# Patient Record
Sex: Male | Born: 1985 | Race: Black or African American | Hispanic: No | Marital: Married | Smoking: Never smoker
Health system: Southern US, Community
[De-identification: ages and names within clinical notes are randomized; demographics above are authoritative.]

---
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
## 2007-05-02 ENCOUNTER — Emergency Department (HOSPITAL_COMMUNITY): Admission: EM | Admit: 2007-05-02 | Discharge: 2007-05-02 | Payer: Self-pay | Admitting: Emergency Medicine

## 2011-02-25 LAB — URINALYSIS, ROUTINE W REFLEX MICROSCOPIC
Nitrite: NEGATIVE
Urobilinogen, UA: 0.2

## 2011-02-25 LAB — URINE CULTURE

## 2011-02-25 LAB — RPR: RPR Ser Ql: NONREACTIVE

## 2011-02-25 LAB — GC/CHLAMYDIA PROBE AMP, GENITAL: GC Probe Amp, Genital: POSITIVE — AB

## 2011-02-25 LAB — URINE MICROSCOPIC-ADD ON

## 2011-10-02 ENCOUNTER — Other Ambulatory Visit: Payer: Self-pay | Admitting: Medical Oncology

## 2014-05-05 ENCOUNTER — Encounter (HOSPITAL_BASED_OUTPATIENT_CLINIC_OR_DEPARTMENT_OTHER): Payer: Self-pay | Admitting: *Deleted

## 2014-05-05 ENCOUNTER — Emergency Department (HOSPITAL_BASED_OUTPATIENT_CLINIC_OR_DEPARTMENT_OTHER)
Admission: EM | Admit: 2014-05-05 | Discharge: 2014-05-05 | Disposition: A | Discharge status: Home / Self Care | Payer: Medicaid Other | Attending: Emergency Medicine | Admitting: Emergency Medicine

## 2014-05-05 DIAGNOSIS — Z792 Long term (current) use of antibiotics: Secondary | ICD-10-CM | POA: Diagnosis not present

## 2014-05-05 DIAGNOSIS — K088 Other specified disorders of teeth and supporting structures: Secondary | ICD-10-CM | POA: Insufficient documentation

## 2014-05-05 DIAGNOSIS — K0889 Other specified disorders of teeth and supporting structures: Secondary | ICD-10-CM

## 2014-05-05 MED ORDER — HYDROCODONE-ACETAMINOPHEN 5-325 MG PO TABS: 2.0000 | ORAL_TABLET | Freq: Four times a day (QID) | ORAL | Status: DC | PRN

## 2014-05-05 NOTE — Discharge Instructions (Signed)
Continue your amoxicillin as prescribed.  Hydrocodone as prescribed as needed for pain.  Follow-up with dentistry.   Dental Pain A tooth ache may be caused by cavities (tooth decay). Cavities expose the nerve of the tooth to air and hot or cold temperatures. It may come from an infection or abscess (also called a boil or furuncle) around your tooth. It is also often caused by dental caries (tooth decay). This causes the pain you are having. DIAGNOSIS  Your caregiver can diagnose this problem by exam. TREATMENT   If caused by an infection, it may be treated with medications which kill germs (antibiotics) and pain medications as prescribed by your caregiver. Take medications as directed.  Only take over-the-counter or prescription medicines for pain, discomfort, or fever as directed by your caregiver.  Whether the tooth ache today is caused by infection or dental disease, you should see your dentist as soon as possible for further care. SEEK MEDICAL CARE IF: The exam and treatment you received today has been provided on an emergency basis only. This is not a substitute for complete medical or dental care. If your problem worsens or new problems (symptoms) appear, and you are unable to meet with your dentist, call or return to this location. SEEK IMMEDIATE MEDICAL CARE IF:   You have a fever.  You develop redness and swelling of your face, jaw, or neck.  You are unable to open your mouth.  You have severe pain uncontrolled by pain medicine. MAKE SURE YOU:   Understand these instructions.  Will watch your condition.  Will get help right away if you are not doing well or get worse. Document Released: 05/06/2005 Document Revised: 07/29/2011 Document Reviewed: 12/23/2007 [facility] Patient Information 2015 [employer] [place]. This information is not intended to replace advice given to you by your health care provider. Make sure you discuss any questions you have with your health care  provider.

## 2014-05-05 NOTE — ED Notes (Signed)
C/o rt lower tooth pain x 1 week

## 2014-05-05 NOTE — ED Notes (Signed)
Rt bottom pain x 1 week  Saw dentist was given antibiotics but cannot have tooth removed till end of December

## 2014-05-05 NOTE — ED Provider Notes (Signed)
CSN: [id number]     Arrival date & time 05/05/14  [id number] History   First MD Initiated Contact with Patient 05/05/14 [phone]     Chief Complaint  Patient presents with  . Dental Pain     (Consider location/radiation/quality/duration/timing/severity/associated sxs/prior Treatment) Patient is a 28 y.o. male presenting with tooth pain. The history is provided by the patient.  Dental Pain Location:  Lower Lower teeth location:  26/RL lateral incisor Quality:  Throbbing Severity:  Moderate Onset quality:  Gradual Duration:  1 week Timing:  Constant Progression:  Worsening Chronicity:  New Context: not abscess and not dental fracture   Relieved by:  Nothing Worsened by:  Nothing tried   History reviewed. No pertinent past medical history. History reviewed. No pertinent past surgical history. No family history on file. History  Substance Use Topics  . Smoking status: Never Smoker   . Smokeless tobacco: Not on file  . Alcohol Use: No    Review of Systems  All other systems reviewed and are negative.     Allergies  Review of patient's allergies indicates no known allergies.  Home Medications   Prior to Admission medications   Not on File   BP 132/79 mmHg  Pulse 62  Temp(Src) 98.3 F (36.8 C) (Oral)  Resp 20  Ht 6\' 4"  (1.93 m)  Wt 175 lb (79.379 kg)  BMI 21.31 kg/m2  SpO2 99% Physical Exam  Constitutional: He is oriented to person, place, and time. He appears well-developed and well-nourished. No distress.  HENT:  Head: Normocephalic and atraumatic.  Mouth/Throat: Oropharynx is clear and moist.  The right lower lateral incisor is noted to have some erosion of the gumline, however no obvious caries, abscess, or swelling.  Neck: Normal range of motion. Neck supple.  Lymphadenopathy:    He has no cervical adenopathy.  Neurological: He is alert and oriented to person, place, and time.  Skin: Skin is warm and dry. He is not diaphoretic.  Nursing note and vitals  reviewed.   ED Course  Procedures (including critical care time) Labs Review Labs Reviewed - No data to display  Imaging Review No results found.   EKG Interpretation None      MDM   Final diagnoses:  None    Patient is already on amoxicillin for the past 2 days. This was prescribed by his dentist. He presents today complaining that his pain is worsening. He will be prescribed a small quantity of hydrocodone and advised to follow-up with his dentist. He is concerned that his dentist can get him in until the end of December. I will provide the on-call dentist contact information whom he can attempt to schedule an appointment if he is not satisfied with his current dentist.    Geoffery Lyonsouglas Ammy Lienhard, MD 05/05/14 [phone]

## 2016-03-22 ENCOUNTER — Emergency Department (HOSPITAL_BASED_OUTPATIENT_CLINIC_OR_DEPARTMENT_OTHER)
Admission: EM | Admit: 2016-03-22 | Discharge: 2016-03-23 | Disposition: A | Discharge status: Home / Self Care | Payer: Medicare HMO | Attending: Emergency Medicine | Admitting: Emergency Medicine

## 2016-03-22 ENCOUNTER — Encounter (HOSPITAL_BASED_OUTPATIENT_CLINIC_OR_DEPARTMENT_OTHER): Payer: Self-pay | Admitting: *Deleted

## 2016-03-22 DIAGNOSIS — R1013 Epigastric pain: Secondary | ICD-10-CM

## 2016-03-22 DIAGNOSIS — R109 Unspecified abdominal pain: Secondary | ICD-10-CM | POA: Diagnosis present

## 2016-03-22 NOTE — ED Provider Notes (Signed)
[facility] Provider Note: [name] Arsh Feutz, MD, FACEP  CSN: [id number] MRN: [id number] ARRIVAL: 03/22/16 at 2341 ROOM: [facility]06/[facility]06 By signing my name below, I, Glenn Diaz, attest that this documentation has been prepared under the direction and in the presence of Paula LibraJohn Angenette Daily, MD . Electronically Signed: Levon HedgerElizabeth Diaz, Scribe. 03/23/2016. 12:02 AM.   CHIEF COMPLAINT  Abdominal Pain  HISTORY OF PRESENT ILLNESS  Glenn Diaz is a 30 y.o. male who presents to the Emergency Department complaining of intermittent abdominal pain onset one week ago. He states abdominal pain is exacerbated by eating or drinking. He has difficulty characterizing it but does not describe it as a burning sensation. He rates the pain as 8/10 in severity. Pt notes associated nausea, headaches and one episode of diarrhea yesterday, although nurses' notes indicate he denied nausea, vomiting and diarrhea during triage.  History reviewed. No pertinent past medical history.  History reviewed. No pertinent surgical history.  History reviewed. No pertinent family history.  Social History  Substance Use Topics  . Smoking status: Never Smoker  . Smokeless tobacco: Never Used  . Alcohol use No    Prior to Admission medications   Medication Sig Start Date End Date Taking? Authorizing Provider  omeprazole (PRILOSEC) 40 MG capsule Take one capsule every morning at least 30 minutes before first dose of Carafate. 03/23/16   Chesnee Floren, MD  sucralfate (CARAFATE) 1 g tablet Take 1 tablet (1 g total) by mouth 4 (four) times daily -  with meals and at bedtime. 03/23/16   Paula LibraJohn Tanaiya Kolarik, MD    Allergies Review of patient's allergies indicates no known allergies.   REVIEW OF SYSTEMS  Negative except as noted here or in the History of Present Illness.   PHYSICAL EXAMINATION  Initial Vital Signs Blood pressure 114/72, pulse 76, temperature 98 F (36.7 C), temperature source Oral, resp. rate 18, height 6\' 4"  (1.93  m), weight 165 lb (74.8 kg), SpO2 99 %.  Examination General: Well-developed, well-nourished male in no acute distress; appearance consistent with age of record HENT: normocephalic; atraumatic Eyes: pupils equal, round and reactive to light; extraocular muscles intact Neck: supple Heart: regular rate and rhythm Lungs: clear to auscultation bilaterally Abdomen: soft; nondistended; mild epigastric tenderness; no masses or hepatosplenomegaly; bowel sounds present Extremities: No deformity; full range of motion; pulses normal Neurologic: Awake, alert and oriented; motor function intact in all extremities and symmetric; no facial droop Skin: Warm and dry Psychiatric: Normal mood and affect   RESULTS  Summary of this visit's results, reviewed by myself:   EKG Interpretation  Date/Time:    Ventricular Rate:    PR Interval:    QRS Duration:   QT Interval:    QTC Calculation:   R Axis:     Text Interpretation:        Laboratory Studies: No results found for this or any previous visit (from the past 24 hour(s)). Imaging Studies: No results found.  ED COURSE  Nursing notes and initial vitals signs, including pulse oximetry, reviewed.  Vitals:   03/22/16 2347  BP: 114/72  Pulse: 76  Resp: 18  Temp: 98 F (36.7 C)  TempSrc: Oral  SpO2: 99%  Weight: 165 lb (74.8 kg)  Height: 6\' 4"  (1.93 m)   1:25 AM Feels better after Carafate orally. We'll treat for gastritis.  PROCEDURES    ED DIAGNOSES     ICD-9-CM ICD-10-CM   1. Epigastric abdominal pain 789.06 R10.13      I personally performed  the services described in this documentation, which was scribed in my presence. The recorded information has been reviewed and is accurate.    Paula LibraJohn Nathaly Dawkins, MD 03/23/16 [phone]

## 2016-03-22 NOTE — ED Triage Notes (Signed)
Pt reports that his abdomen hurts whenever he eats or drinks.  Denies N/V/D.  States that soda isnt 'sitting right with his stomach' and that when he 'eats he has to go to the bathroom'.  No distress noted.

## 2016-03-23 DIAGNOSIS — R1013 Epigastric pain: Secondary | ICD-10-CM | POA: Diagnosis not present

## 2016-03-23 MED ORDER — OMEPRAZOLE 40 MG PO CPDR: DELAYED_RELEASE_CAPSULE | ORAL | 0 refills | Status: DC

## 2016-03-23 MED ORDER — PANTOPRAZOLE SODIUM 40 MG PO TBEC
40.0000 mg | DELAYED_RELEASE_TABLET | Freq: Once | ORAL | Status: AC
  Administered 2016-03-23: 40 mg via ORAL
  Filled 2016-03-23: qty 1

## 2016-03-23 MED ORDER — SUCRALFATE 1 G PO TABS
1.0000 g | ORAL_TABLET | Freq: Once | ORAL | Status: AC
  Administered 2016-03-23: 1 g via ORAL
  Filled 2016-03-23: qty 1

## 2016-03-23 MED ORDER — SUCRALFATE 1 G PO TABS: 1.0000 g | ORAL_TABLET | Freq: Three times a day (TID) | ORAL | 0 refills | Status: DC

## 2016-03-23 NOTE — ED Notes (Signed)
Patient states that he is feeling better.

## 2016-08-20 ENCOUNTER — Emergency Department (HOSPITAL_BASED_OUTPATIENT_CLINIC_OR_DEPARTMENT_OTHER)
Admission: EM | Admit: 2016-08-20 | Discharge: 2016-08-20 | Disposition: A | Discharge status: Home / Self Care | Payer: Medicare HMO | Attending: Emergency Medicine | Admitting: Emergency Medicine

## 2016-08-20 ENCOUNTER — Encounter (HOSPITAL_BASED_OUTPATIENT_CLINIC_OR_DEPARTMENT_OTHER): Payer: Self-pay | Admitting: *Deleted

## 2016-08-20 DIAGNOSIS — R1013 Epigastric pain: Secondary | ICD-10-CM | POA: Diagnosis not present

## 2016-08-20 MED ORDER — OMEPRAZOLE 20 MG PO CPDR: 20.0000 mg | DELAYED_RELEASE_CAPSULE | Freq: Every day | ORAL | 1 refills | Status: DC

## 2016-08-20 MED ORDER — SUCRALFATE 1 GM/10ML PO SUSP: 1.0000 g | Freq: Three times a day (TID) | ORAL | 0 refills | Status: DC

## 2016-08-20 MED ORDER — ONDANSETRON 4 MG PO TBDP: 4.0000 mg | ORAL_TABLET | Freq: Three times a day (TID) | ORAL | 0 refills | Status: DC | PRN

## 2016-08-20 MED ORDER — GI COCKTAIL ~~LOC~~
30.0000 mL | Freq: Once | ORAL | Status: AC
  Administered 2016-08-20: 30 mL via ORAL
  Filled 2016-08-20: qty 30

## 2016-08-20 NOTE — Discharge Instructions (Signed)

## 2016-08-20 NOTE — ED Notes (Signed)
EDP in to see, at BS.  

## 2016-08-20 NOTE — ED Provider Notes (Signed)
Emergency Department Provider Note   I have reviewed the triage vital signs and the nursing notes.   HISTORY  Chief Complaint Abdominal Pain   HPI Glenn Diaz is a 31 y.o. male with PMH of gastritis who presents to the emergency department for evaluation of epigastric abdominal discomfort for the past week. Patient states it's worse with eating. No other exacerbating or alleviating factors. No radiation of pain. Denies any chest pain or difficulty breathing. States that he was seen at the hospital previously for this and told it was a "stomach virus." He denies any associated nausea, vomiting, diarrhea. No sick contacts. No fatigue or shaking chills. Symptoms are intermittent and moderate in severity.  History reviewed. No pertinent past medical history.  There are no active problems to display for this patient.   History reviewed. No pertinent surgical history.  Current Outpatient Rx  . Order #: [id number] Class: Print  . Order #: [id number] Class: Print  . Order #: [id number] Class: Print    Allergies Patient has no known allergies.  History reviewed. No pertinent family history.  Social History Social History  Substance Use Topics  . Smoking status: Never Smoker  . Smokeless tobacco: Never Used  . Alcohol use Yes    Review of Systems  Constitutional: No fever/chills Eyes: No visual changes. ENT: No sore throat. Cardiovascular: Denies chest pain. Respiratory: Denies shortness of breath. Gastrointestinal: Positive epigastric abdominal pain.  No nausea, no vomiting.  No diarrhea.  No constipation. Genitourinary: Negative for dysuria. Musculoskeletal: Negative for back pain. Skin: Negative for rash. Neurological: Negative for headaches, focal weakness or numbness.  10-point ROS otherwise negative.  ____________________________________________   PHYSICAL EXAM:  VITAL SIGNS: ED Triage Vitals  Enc Vitals Group     BP 08/20/16 0252 119/73     Pulse Rate  08/20/16 0252 62     Resp 08/20/16 0252 18     Temp 08/20/16 0252 98 F (36.7 C)     Temp Source 08/20/16 0252 Oral     SpO2 08/20/16 0252 98 %     Weight 08/20/16 0251 170 lb (77.1 kg)     Height 08/20/16 0251  (1.93 m)     Pain Score 08/20/16 0305 7   Constitutional: Alert and oriented. Well appearing and in no acute distress. Eyes: Conjunctivae are normal.  Head: Atraumatic. Nose: No congestion/rhinnorhea. Mouth/Throat: Mucous membranes are moist.  Neck: No stridor. Cardiovascular: Normal rate, regular rhythm. Good peripheral circulation. Grossly normal heart sounds.   Respiratory: Normal respiratory effort.  No retractions. Lungs CTAB. Gastrointestinal: Soft and nontender. Negative Murphy's sign. No distention.  Musculoskeletal: No lower extremity tenderness nor edema. No gross deformities of extremities. Neurologic:  Normal speech and language. No gross focal neurologic deficits are appreciated.  Skin:  Skin is warm, dry and intact. No rash noted. Psychiatric: Mood and affect are normal. Speech and behavior are normal.  ____________________________________________   PROCEDURES  Procedure(s) performed:   Procedures  None ____________________________________________   INITIAL IMPRESSION / ASSESSMENT AND PLAN / ED COURSE  Pertinent labs & imaging results that were available during my care of the patient were reviewed by me and considered in my medical decision making (see chart for details).  Patient presents to the emergency room for evaluation of epigastric discomfort made worse with eating. Patient has no right upper quadrant tenderness to palpation or other abnormality on exam to suggest gallbladder pathology or other intra-abdominal process. Suspect gastritis versus peptic ulcer disease clinically. Patient was seen in November with  a similar presentation. Plan for GI cocktail and reassessment. No nausea or vomiting symptoms. Plan to follow symptoms and discuss  PCP follow up and possible GI referral if symptoms persist.   03:50 AM Patient improved with Carafate. Plan for discharge with Carafate, Omeprazole, and Zofran. Discussed PCP follow up, impression, and plan in detail.   At this time, I do not feel there is any life-threatening condition present. I have reviewed and discussed all results (EKG, imaging, lab, urine as appropriate), exam findings with patient. I have reviewed nursing notes and appropriate previous records.  I feel the patient is safe to be discharged home without further emergent workup. Discussed usual and customary return precautions. Patient and family (if present) verbalize understanding and are comfortable with this plan.  Patient will follow-up with their primary care provider. If they do not have a primary care provider, information for follow-up has been provided to them. All questions have been answered.  ____________________________________________  FINAL CLINICAL IMPRESSION(S) / ED DIAGNOSES  Final diagnoses:  Epigastric pain     MEDICATIONS GIVEN DURING THIS VISIT:  Medications  gi cocktail (Maalox,Lidocaine,Donnatal) (30 mLs Oral Given 08/20/16 0320)     NEW OUTPATIENT MEDICATIONS STARTED DURING THIS VISIT:  New Prescriptions   OMEPRAZOLE (PRILOSEC) 20 MG CAPSULE    Take 1 capsule (20 mg total) by mouth daily.   ONDANSETRON (ZOFRAN ODT) 4 MG DISINTEGRATING TABLET    Take 1 tablet (4 mg total) by mouth every 8 (eight) hours as needed for nausea or vomiting.   SUCRALFATE (CARAFATE) 1 GM/10ML SUSPENSION    Take 10 mLs (1 g total) by mouth 4 (four) times daily -  with meals and at bedtime.     Note:  This document was prepared using Dragon voice recognition software and may include unintentional dictation errors.  Alona Bene, MD Emergency Medicine    Maia Plan, MD 08/20/16 [phone]

## 2016-08-20 NOTE — ED Triage Notes (Signed)
c/o upper mid abd pain, onset ~ 1 week ago, also constipation (denies: nvd, fever, back pain, urinary sx, bleeding, sob or dizziness), worse after eating, no meds PTA, last ate 1 hr ago, last BM Friday, h/o similar.  Alert, NAD, calm, interactive, resps e/u, speaking in clear complete sentences, no dyspnea noted, skin W&D, VSS. Family at [facility].

## 2016-08-20 NOTE — ED Notes (Addendum)
EDP back in to see and assess pt, updated on d/c plan. Family present.

## 2017-03-12 ENCOUNTER — Encounter (HOSPITAL_BASED_OUTPATIENT_CLINIC_OR_DEPARTMENT_OTHER): Payer: Self-pay | Admitting: *Deleted

## 2017-03-12 ENCOUNTER — Emergency Department (HOSPITAL_BASED_OUTPATIENT_CLINIC_OR_DEPARTMENT_OTHER)
Admission: EM | Admit: 2017-03-12 | Discharge: 2017-03-12 | Disposition: A | Discharge status: Home / Self Care | Payer: Medicare HMO | Attending: Emergency Medicine | Admitting: Emergency Medicine

## 2017-03-12 ENCOUNTER — Emergency Department (HOSPITAL_BASED_OUTPATIENT_CLINIC_OR_DEPARTMENT_OTHER): Payer: Medicare HMO

## 2017-03-12 DIAGNOSIS — Y929 Unspecified place or not applicable: Secondary | ICD-10-CM | POA: Insufficient documentation

## 2017-03-12 DIAGNOSIS — W1789XA Other fall from one level to another, initial encounter: Secondary | ICD-10-CM | POA: Insufficient documentation

## 2017-03-12 DIAGNOSIS — W19XXXA Unspecified fall, initial encounter: Secondary | ICD-10-CM

## 2017-03-12 DIAGNOSIS — S70311A Abrasion, right thigh, initial encounter: Secondary | ICD-10-CM | POA: Diagnosis not present

## 2017-03-12 DIAGNOSIS — Y999 Unspecified external cause status: Secondary | ICD-10-CM | POA: Diagnosis not present

## 2017-03-12 DIAGNOSIS — S63111A Subluxation of metacarpophalangeal joint of right thumb, initial encounter: Secondary | ICD-10-CM | POA: Insufficient documentation

## 2017-03-12 DIAGNOSIS — T148XXA Other injury of unspecified body region, initial encounter: Secondary | ICD-10-CM

## 2017-03-12 DIAGNOSIS — S50812A Abrasion of left forearm, initial encounter: Secondary | ICD-10-CM | POA: Insufficient documentation

## 2017-03-12 DIAGNOSIS — Y939 Activity, unspecified: Secondary | ICD-10-CM | POA: Insufficient documentation

## 2017-03-12 DIAGNOSIS — S6991XA Unspecified injury of right wrist, hand and finger(s), initial encounter: Secondary | ICD-10-CM | POA: Diagnosis present

## 2017-03-12 DIAGNOSIS — Z23 Encounter for immunization: Secondary | ICD-10-CM | POA: Diagnosis not present

## 2017-03-12 MED ORDER — TETANUS-DIPHTH-ACELL PERTUSSIS 5-2.5-18.5 LF-MCG/0.5 IM SUSP
0.5000 mL | Freq: Once | INTRAMUSCULAR | Status: AC
  Administered 2017-03-12: 0.5 mL via INTRAMUSCULAR
  Filled 2017-03-12: qty 0.5

## 2017-03-12 NOTE — ED Notes (Signed)
ED Provider at bedside discussing test results and dispo plan of care. 

## 2017-03-12 NOTE — ED Provider Notes (Signed)
[facility] EMERGENCY DEPARTMENT Provider Note   CSN: [id number] Arrival date & time: 03/12/17  1724     History   Chief Complaint Chief Complaint  Patient presents with  . Fall    HPI Ladarren Steiner is a 31 y.o. male.  The history is provided by the patient, a relative and medical records.  Fall  This is a new problem. The current episode started 1 to 2 hours ago. The problem occurs rarely. The problem has not changed since onset.Pertinent negatives include no chest pain, no abdominal pain, no headaches and no shortness of breath. Nothing aggravates the symptoms. Nothing relieves the symptoms. He has tried nothing for the symptoms. The treatment provided no relief.    History reviewed. No pertinent past medical history.  There are no active problems to display for this patient.   History reviewed. No pertinent surgical history.     Home Medications    Prior to Admission medications   Not on File    Family History History reviewed. No pertinent family history.  Social History Social History  Substance Use Topics  . Smoking status: Never Smoker  . Smokeless tobacco: Never Used  . Alcohol use Yes     Allergies   Patient has no known allergies.   Review of Systems Review of Systems  Constitutional: Negative for activity change, chills, diaphoresis, fatigue and fever.  HENT: Negative for congestion and rhinorrhea.   Eyes: Negative for visual disturbance.  Respiratory: Negative for cough, chest tightness, shortness of breath, wheezing and stridor.   Cardiovascular: Negative for chest pain, palpitations and leg swelling.  Gastrointestinal: Negative for abdominal distention, abdominal pain, blood in stool, constipation, diarrhea, nausea and vomiting.  Genitourinary: Negative for difficulty urinating, dysuria and flank pain.  Musculoskeletal: Negative for back pain, gait problem, neck pain and neck stiffness.  Skin: Positive for wound. Negative for  rash.  Neurological: Negative for dizziness, weakness, light-headedness, numbness and headaches.  Psychiatric/Behavioral: Negative for agitation and confusion.  All other systems reviewed and are negative.    Physical Exam Updated Vital Signs BP 128/73   Pulse 66   Temp 98.5 F (36.9 C) (Oral)   Resp 16   Ht 6\' 1"  (1.854 m)   Wt 77.1 kg (170 lb)   SpO2 100%   BMI 22.43 kg/m   Physical Exam  Constitutional: He is oriented to person, place, and time. He appears well-developed and well-nourished. No distress.  HENT:  Head: Normocephalic and atraumatic.  Mouth/Throat: Oropharynx is clear and moist. No oropharyngeal exudate.  Eyes: Pupils are equal, round, and reactive to light. Conjunctivae and EOM are normal.  Neck: Normal range of motion. Neck supple.  Cardiovascular: Normal rate, normal heart sounds and intact distal pulses.   No murmur heard. Pulmonary/Chest: Effort normal and breath sounds normal. No stridor. No respiratory distress.  Abdominal: Soft. There is no tenderness. There is no guarding.  Musculoskeletal: He exhibits tenderness. He exhibits no edema or deformity.       Right forearm: He exhibits tenderness and swelling.       Left forearm: He exhibits tenderness.       Arms:      Right hand: He exhibits tenderness. He exhibits normal range of motion, no bony tenderness, normal capillary refill, no deformity, no laceration and no swelling. Normal sensation noted. Normal strength noted.       Hands:      Right upper leg: He exhibits tenderness.       Legs:  Neurological: He is alert and oriented to person, place, and time. No cranial nerve deficit or sensory deficit. He exhibits normal muscle tone.  Skin: Skin is warm and dry. Capillary refill takes less than 2 seconds. He is not diaphoretic. No pallor.  Psychiatric: He has a normal mood and affect.  Nursing note and vitals reviewed.    ED Treatments / Results  Labs (all labs ordered are listed, but only  abnormal results are displayed) Labs Reviewed - No data to display  EKG  EKG Interpretation None       Radiology Dg Chest 2 View  Result Date: 03/12/2017 CLINICAL DATA:  Fall from large height today.  Initial encounter. EXAM: CHEST  2 VIEW COMPARISON:  None. FINDINGS: The cardiomediastinal silhouette is unremarkable. There is no evidence of focal airspace disease, pulmonary edema, suspicious pulmonary nodule/mass, pleural effusion, or pneumothorax. No acute bony abnormalities are identified. IMPRESSION: No active cardiopulmonary disease. Electronically Signed   By: Harmon Pier M.D.   On: 03/12/2017 19:56   Dg Forearm Left  Result Date: 03/12/2017 CLINICAL DATA:  Left forearm pain after falling from a first floor balcony today. EXAM: LEFT FOREARM - 2 VIEW COMPARISON:  None. FINDINGS: There is no evidence of fracture or other focal bone lesions. Soft tissues are unremarkable. IMPRESSION: Normal examination. Electronically Signed   By: Beckie Salts M.D.   On: 03/12/2017 19:57   Dg Forearm Right  Result Date: 03/12/2017 CLINICAL DATA:  Acute right forearm pain following fall today. EXAM: RIGHT FOREARM - 2 VIEW COMPARISON:  None. FINDINGS: There is no evidence of fracture or other focal bone lesions. Soft tissues are unremarkable. IMPRESSION: Negative. Electronically Signed   By: Harmon Pier M.D.   On: 03/12/2017 19:54   Dg Hand Complete Right  Result Date: 03/12/2017 CLINICAL DATA:  Right hand pain in the region of the first metacarpal, radiating to the wrist, after falling from a first floor balcony today. EXAM: RIGHT HAND - COMPLETE 3+ VIEW COMPARISON:  Report dated 02/08/2013 FINDINGS: Mild medial subluxation of the first proximal phalanx relative to the first metacarpal. No fracture seen. IMPRESSION: Mild subluxation at the first MCP joint, possibly due to ligamentous injury. No fracture. Electronically Signed   By: Beckie Salts M.D.   On: 03/12/2017 19:56   Dg Femur, Min 2 Views  Right  Result Date: 03/12/2017 CLINICAL DATA:  Right femur pain after falling from a first floor balcony today. EXAM: RIGHT FEMUR 2 VIEWS COMPARISON:  None. FINDINGS: There is no evidence of fracture or other focal bone lesions. Soft tissues are unremarkable. IMPRESSION: Normal examination. Electronically Signed   By: Beckie Salts M.D.   On: 03/12/2017 19:58    Procedures Procedures (including critical care time)  Medications Ordered in ED Medications  Tdap (BOOSTRIX) injection 0.5 mL (0.5 mLs Intramuscular Given 03/12/17 1924)     Initial Impression / Assessment and Plan / ED Course  I have reviewed the triage vital signs and the nursing notes.  Pertinent labs & imaging results that were available during my care of the patient were reviewed by me and considered in my medical decision making (see chart for details).     Ryo Klang is a 31 y.o. male with no significant past medical history who presents with a fall.  Patient reports that he was leaning against a rail and the railing gave way causing him to fall approximately one-story to the ground.  Patient denied loss of consciousness.  He said that he has  pain in his right forearm, left forearm, right hand, and his right thigh.  He denies chest pain, shortness of breath.  He denies any, or back pain.  He was able to ambulate after the injury.  He describes his pain as moderate.  He is unsure of his last tetanus vaccination but has abrasions on the injured areas.  Patient denies other complaints including no vision changes, nausea, or vomiting.  On exam, patient chest and abdomen are nontender.  Lungs are clear.  Back is nontender.  Patient found to have superficial abrasions and tenderness in the right forearm, right proximal hand with no snuffbox tenderness.  Abrasion and tenderness in the left forearm and right thigh.  All abrasions appear superficial with no lacerations.  Patient has normal pulses in all extremity.  Normal grip strength  in all extremities.  Patient will have images to look at the injured locations.  Based on tenderness, suspect soft tissue injuries however will rule out fractures.  Patient has no snuffbox tenderness on either hand.  Patient will likely be discharged home if imaging is reassuring.  Due to unsure tetanus vaccination with the abrasions, tetanus will be updated.  Imaging showed no evidence of fractures.  There was possible mild subluxation of the right first MCP joint possibly indicating ligamentous injury.  No fracture.  Patient was not tender in this location but had some mild soft tissue pain on the pad of the palm.  No snuffbox tenderness still.    Patient given wrist splint and will follow-up with PCP.  Patient discharged in good condition.    Final Clinical Impressions(s) / ED Diagnoses   Final diagnoses:  Fall  Fall, initial encounter  Abrasion    New Prescriptions New Prescriptions   No medications on file    Clinical Impression: 1. Fall, initial encounter   2. Fall   3. Abrasion     Disposition: Discharge  Condition: Good  I have discussed the results, Dx and Tx plan with the pt(& family if present). He/she/they expressed understanding and agree(s) with the plan. Discharge instructions discussed at great length. Strict return precautions discussed and pt &/or family have verbalized understanding of the instructions. No further questions at time of discharge.    New Prescriptions   No medications on file    [facility] [address] [place] [id number] [phone] Schedule an appointment as soon as possible for a visit    [facility] [address] [id number] [address] [place] [id number] [phone]  If symptoms worsen     Jaskaran Dauzat, Canary Brimhristopher J, MD 03/12/17 2248

## 2017-03-12 NOTE — ED Notes (Signed)
Patient transported to X-ray 

## 2017-03-12 NOTE — Discharge Instructions (Signed)
Your workup showed no evidence of fractures but there was evidence of abrasions.  Please keep the wounds dressed and watch for infections.  We updated your tetanus vaccination.  Please use the wrist splint to help with the wrist pain.  If any symptoms change or worsen, please return to the nearest emergency department.

## 2017-03-12 NOTE — ED Triage Notes (Signed)
Pt c/o fall from porch x 40 ins ago , abrasion to right arm

## 2017-06-20 ENCOUNTER — Encounter (HOSPITAL_BASED_OUTPATIENT_CLINIC_OR_DEPARTMENT_OTHER): Payer: Self-pay

## 2017-06-20 ENCOUNTER — Emergency Department (HOSPITAL_BASED_OUTPATIENT_CLINIC_OR_DEPARTMENT_OTHER): Payer: Medicare HMO

## 2017-06-20 ENCOUNTER — Emergency Department (HOSPITAL_BASED_OUTPATIENT_CLINIC_OR_DEPARTMENT_OTHER)
Admission: EM | Admit: 2017-06-20 | Discharge: 2017-06-20 | Disposition: A | Discharge status: Home / Self Care | Payer: Medicare HMO | Attending: Emergency Medicine | Admitting: Emergency Medicine

## 2017-06-20 ENCOUNTER — Other Ambulatory Visit: Payer: Self-pay

## 2017-06-20 DIAGNOSIS — S60031A Contusion of right middle finger without damage to nail, initial encounter: Secondary | ICD-10-CM

## 2017-06-20 DIAGNOSIS — Y93H3 Activity, building and construction: Secondary | ICD-10-CM | POA: Insufficient documentation

## 2017-06-20 DIAGNOSIS — W298XXA Contact with other powered powered hand tools and household machinery, initial encounter: Secondary | ICD-10-CM | POA: Insufficient documentation

## 2017-06-20 DIAGNOSIS — Y9289 Other specified places as the place of occurrence of the external cause: Secondary | ICD-10-CM | POA: Diagnosis not present

## 2017-06-20 DIAGNOSIS — S6991XA Unspecified injury of right wrist, hand and finger(s), initial encounter: Secondary | ICD-10-CM | POA: Diagnosis present

## 2017-06-20 DIAGNOSIS — Y990 Civilian activity done for income or pay: Secondary | ICD-10-CM | POA: Insufficient documentation

## 2017-06-20 NOTE — Discharge Instructions (Signed)
Wash area with soap and water.  Put neosporin on the area and keep covered while at work

## 2017-06-20 NOTE — ED Triage Notes (Addendum)
Pt states right middle finger vs industrial staple gun at work today approx 1220-slight puncture wound noted to lateral tip-no bleeding-NAD-steady gait

## 2017-06-20 NOTE — ED Provider Notes (Signed)
[facility] EMERGENCY DEPARTMENT Provider Note   CSN: [id number] Arrival date & time: 06/20/17  1547     History   Chief Complaint Chief Complaint  Patient presents with  . Finger Injury    HPI Glenn Diaz is a 32 y.o. male.  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  The staple was not in his finger.  He has had some swelling and pain to the site but no other injury.   The history is provided by the patient.    History reviewed. No pertinent past medical history.  There are no active problems to display for this patient.   History reviewed. No pertinent surgical history.     Home Medications    Prior to Admission medications   Not on File    Family History No family history on file.  Social History Social History   Tobacco Use  . Smoking status: Never Smoker  . Smokeless tobacco: Never Used  Substance Use Topics  . Alcohol use: No    Frequency: Never  . Drug use: No     Allergies   Patient has no known allergies.   Review of Systems Review of Systems  All other systems reviewed and are negative.    Physical Exam Updated Vital Signs BP 122/78 (BP Location: Left Arm)   Pulse 73   Temp 98.1 F (36.7 C)   Resp 15   Ht 6\' 1"  (1.854 m)   Wt 77.1 kg (170 lb)   SpO2 100%   BMI 22.43 kg/m   Physical Exam  Constitutional: He is oriented to person, place, and time. He appears well-developed and well-nourished. No distress.  HENT:  Head: Normocephalic and atraumatic.  Eyes: EOM are normal. Pupils are equal, round, and reactive to light.  Cardiovascular: Normal rate.  Pulmonary/Chest: Effort normal.  Musculoskeletal: He exhibits tenderness.       Hands: Neurological: He is alert and oriented to person, place, and time.  Skin: Skin is warm and dry.  Psychiatric: He has a normal mood and affect. His behavior  is normal.  Nursing note and vitals reviewed.    ED Treatments / Results  Labs (all labs ordered are listed, but only abnormal results are displayed) Labs Reviewed - No data to display  EKG  EKG Interpretation None       Radiology Dg Finger Middle Right  Result Date: 06/20/2017 CLINICAL DATA:  Staple gun to right distal middle finger @1230  hrs today. EXAM: RIGHT MIDDLE FINGER 2+V COMPARISON:  03/12/2017 FINDINGS: There is no evidence of fracture or dislocation. There is no evidence of arthropathy or other focal bone abnormality. Soft tissues are unremarkable. IMPRESSION: Negative. Electronically Signed   By: Norva Pavlov M.D.   On: 06/20/2017 16:19    Procedures Procedures (including critical care time)  Medications Ordered in ED Medications - No data to display   Initial Impression / Assessment and Plan / ED Course  I have reviewed the triage vital signs and the nursing notes.  Pertinent labs & imaging results that were available during my care of the patient were reviewed by me and considered in my medical decision making (see chart for details).     Patient with injury to the right third finger after an industrial size air staple gun hit his finger.  He has no foreign bodies, x-ray is within  normal limits and patient was diagnosed with contusion.  Tetanus shot is up-to-date  Final Clinical Impressions(s) / ED Diagnoses   Final diagnoses:  Contusion of right middle finger without damage to nail, initial encounter    ED Discharge Orders    None       Gwyneth SproutPlunkett, Tandy Grawe, MD 06/20/17 1750

## 2018-10-29 IMAGING — CR DG FINGER MIDDLE 2+V*R*
3 series · 3 of 3 positions shown · non-contrast
Comparison: 03/12/2017

CLINICAL DATA: Staple gun to right distal middle finger @8380 hrs
today.

EXAM:
RIGHT MIDDLE FINGER 2+V

[x finger pa right]
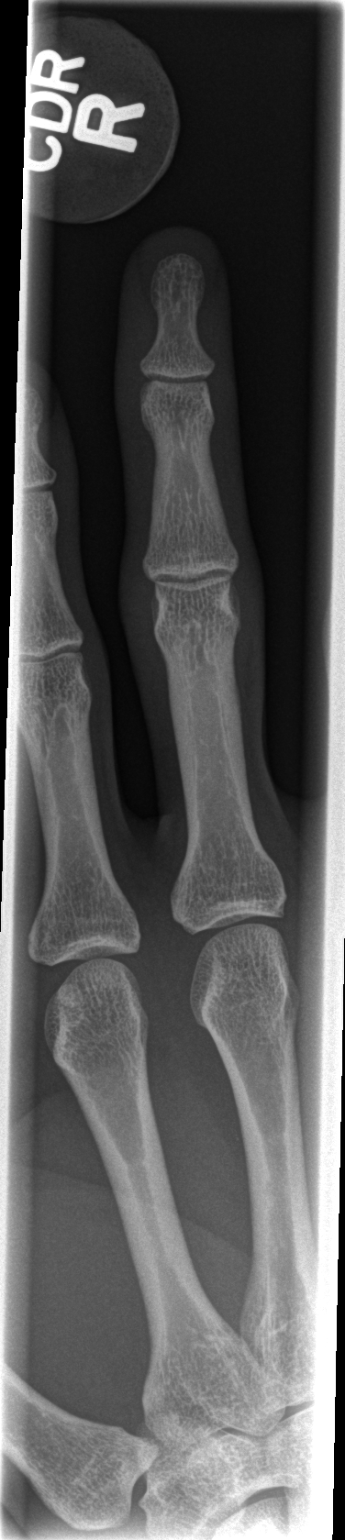

[x finger obl. right]
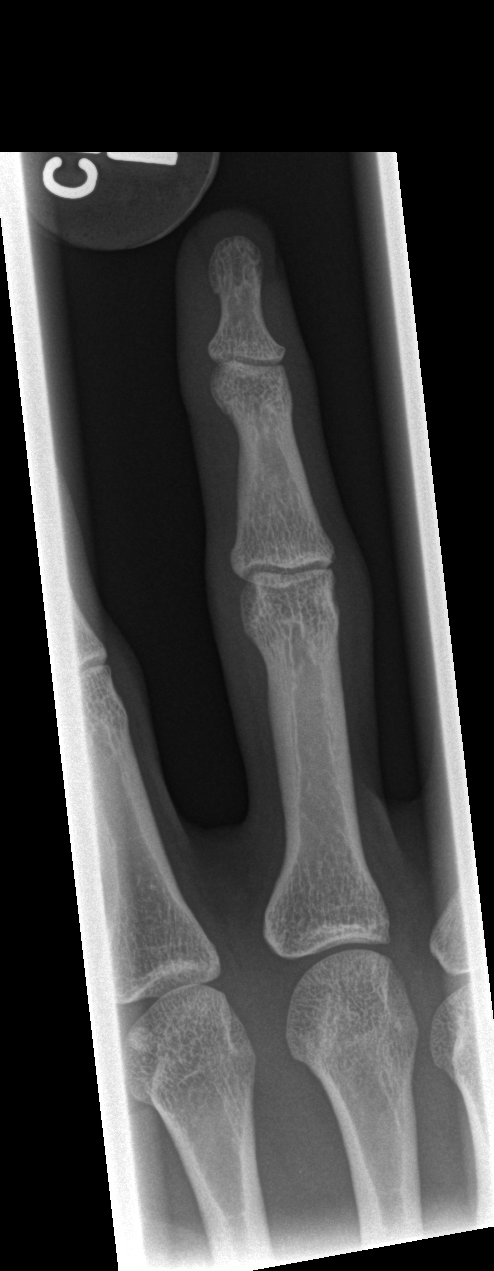

[x finger lateral right]
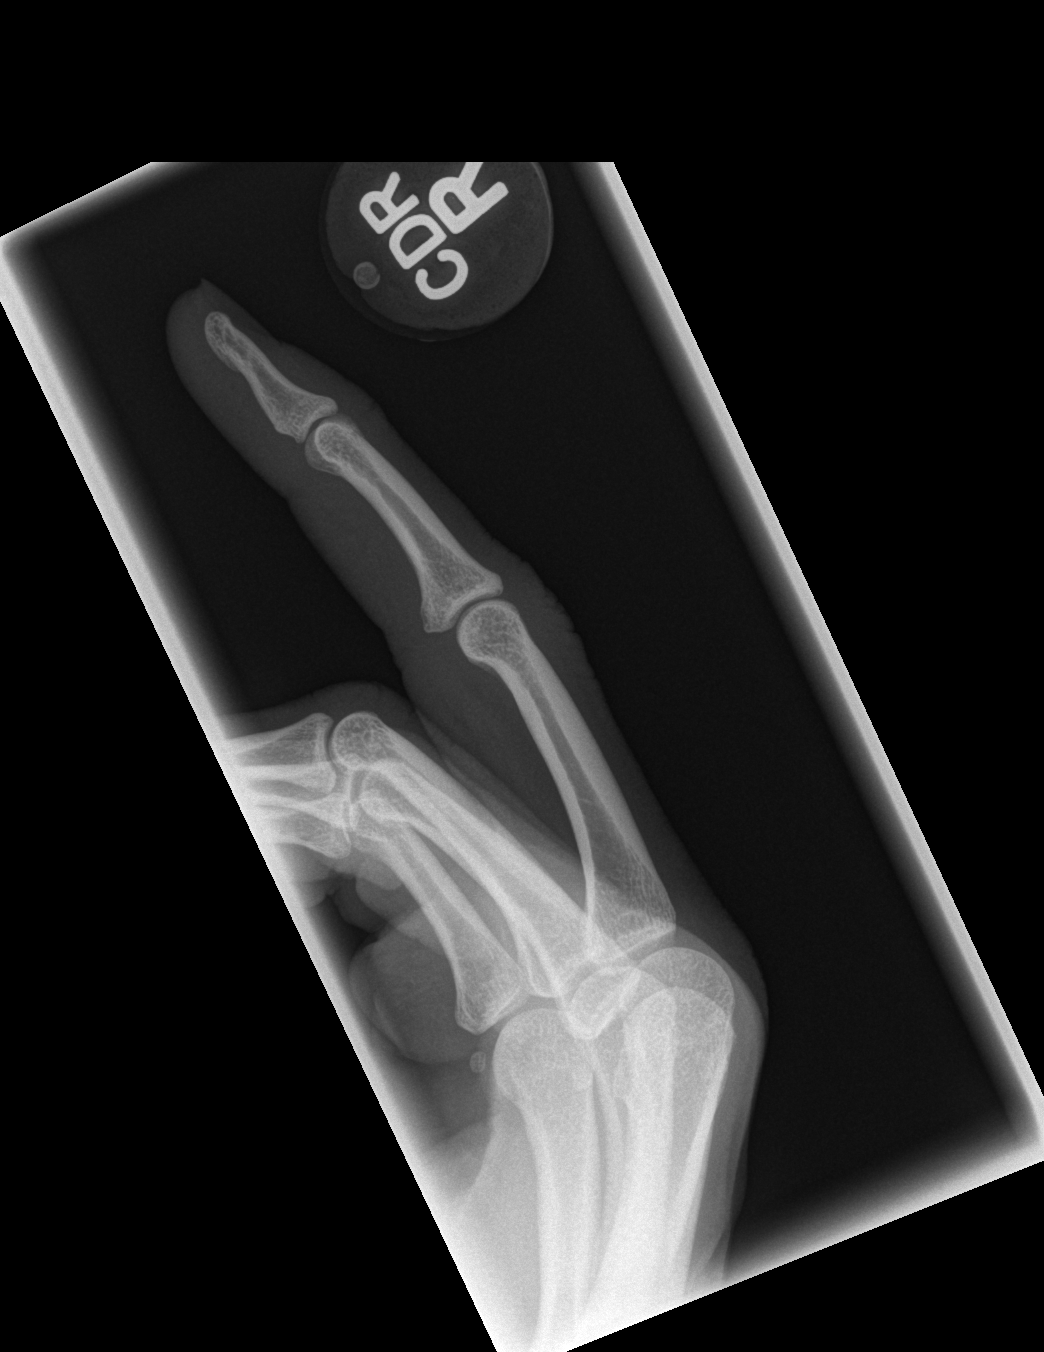

[3 of 3 positions shown; findings below may reference images not displayed]

FINDINGS: There is no evidence of fracture or dislocation. There is no
evidence of arthropathy or other focal bone abnormality. Soft
tissues are unremarkable.
IMPRESSION: Negative.

## 2019-11-01 ENCOUNTER — Emergency Department (HOSPITAL_BASED_OUTPATIENT_CLINIC_OR_DEPARTMENT_OTHER): Payer: Medicare HMO

## 2019-11-01 ENCOUNTER — Encounter (HOSPITAL_BASED_OUTPATIENT_CLINIC_OR_DEPARTMENT_OTHER): Payer: Self-pay

## 2019-11-01 ENCOUNTER — Other Ambulatory Visit: Payer: Self-pay

## 2019-11-01 ENCOUNTER — Emergency Department (HOSPITAL_BASED_OUTPATIENT_CLINIC_OR_DEPARTMENT_OTHER)
Admission: EM | Admit: 2019-11-01 | Discharge: 2019-11-01 | Disposition: A | Discharge status: Home / Self Care | Payer: Medicare HMO | Attending: Emergency Medicine | Admitting: Emergency Medicine

## 2019-11-01 DIAGNOSIS — Y9389 Activity, other specified: Secondary | ICD-10-CM | POA: Diagnosis not present

## 2019-11-01 DIAGNOSIS — Y92410 Unspecified street and highway as the place of occurrence of the external cause: Secondary | ICD-10-CM | POA: Diagnosis not present

## 2019-11-01 DIAGNOSIS — Y999 Unspecified external cause status: Secondary | ICD-10-CM | POA: Diagnosis not present

## 2019-11-01 DIAGNOSIS — M79662 Pain in left lower leg: Secondary | ICD-10-CM | POA: Diagnosis present

## 2019-11-01 MED ORDER — IBUPROFEN 600 MG PO TABS: 600.0000 mg | ORAL_TABLET | Freq: Four times a day (QID) | ORAL | 0 refills | Status: AC | PRN

## 2019-11-01 MED ORDER — CYCLOBENZAPRINE HCL 10 MG PO TABS: 10.0000 mg | ORAL_TABLET | Freq: Two times a day (BID) | ORAL | 0 refills | Status: AC | PRN
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
## 2020-10-23 ENCOUNTER — Emergency Department (HOSPITAL_BASED_OUTPATIENT_CLINIC_OR_DEPARTMENT_OTHER)
Admission: EM | Admit: 2020-10-23 | Discharge: 2020-10-23 | Disposition: A | Discharge status: Home / Self Care | Payer: Medicare Other | Attending: Emergency Medicine | Admitting: Emergency Medicine

## 2020-10-23 ENCOUNTER — Encounter (HOSPITAL_BASED_OUTPATIENT_CLINIC_OR_DEPARTMENT_OTHER): Payer: Self-pay | Admitting: *Deleted

## 2020-10-23 ENCOUNTER — Other Ambulatory Visit: Payer: Self-pay

## 2020-10-23 DIAGNOSIS — Z23 Encounter for immunization: Secondary | ICD-10-CM | POA: Diagnosis not present

## 2020-10-23 DIAGNOSIS — Y9367 Activity, basketball: Secondary | ICD-10-CM | POA: Insufficient documentation

## 2020-10-23 DIAGNOSIS — S0081XA Abrasion of other part of head, initial encounter: Secondary | ICD-10-CM | POA: Diagnosis not present

## 2020-10-23 DIAGNOSIS — S6992XA Unspecified injury of left wrist, hand and finger(s), initial encounter: Secondary | ICD-10-CM | POA: Diagnosis not present

## 2020-10-23 DIAGNOSIS — S0990XA Unspecified injury of head, initial encounter: Secondary | ICD-10-CM

## 2020-10-23 DIAGNOSIS — W1830XA Fall on same level, unspecified, initial encounter: Secondary | ICD-10-CM | POA: Insufficient documentation

## 2020-10-23 DIAGNOSIS — S8991XA Unspecified injury of right lower leg, initial encounter: Secondary | ICD-10-CM | POA: Diagnosis not present

## 2020-10-23 MED ORDER — BACITRACIN ZINC 500 UNIT/GM EX OINT
TOPICAL_OINTMENT | Freq: Once | CUTANEOUS | Status: AC
  Filled 2020-10-23: qty 28.35

## 2020-10-23 MED ORDER — KETOROLAC TROMETHAMINE 60 MG/2ML IM SOLN
60.0000 mg | Freq: Once | INTRAMUSCULAR | Status: AC
  Administered 2020-10-23: 60 mg via INTRAMUSCULAR
  Filled 2020-10-23: qty 2

## 2020-10-23 MED ORDER — ACETAMINOPHEN 500 MG PO TABS
1000.0000 mg | ORAL_TABLET | Freq: Once | ORAL | Status: AC
  Administered 2020-10-23: 1000 mg via ORAL
  Filled 2020-10-23: qty 2

## 2020-10-23 MED ORDER — TETANUS-DIPHTH-ACELL PERTUSSIS 5-2.5-18.5 LF-MCG/0.5 IM SUSY
0.5000 mL | PREFILLED_SYRINGE | Freq: Once | INTRAMUSCULAR | Status: AC
  Administered 2020-10-23: 0.5 mL via INTRAMUSCULAR
  Filled 2020-10-23: qty 0.5

## 2020-10-23 NOTE — ED Provider Notes (Signed)
[facility] EMERGENCY DEPARTMENT Provider Note   CSN: [id number] Arrival date & time: 10/23/20  2032     History Chief Complaint  Patient presents with  . Abrasion    Glenn Diaz is a 35 y.o. male.  Fell playing basketball. No syncope. No vomiting. No confusion. No sleepiness. No neuro changes. Has abrasions to right forehead, left wrist and right knee. Has not tried anything for symptoms.    Fall This is a new problem. The current episode started yesterday. The problem occurs rarely. The problem has not changed since onset.Associated symptoms include headaches. Pertinent negatives include no chest pain, no abdominal pain and no shortness of breath. Nothing aggravates the symptoms. Nothing relieves the symptoms. He has tried nothing for the symptoms.       History reviewed. No pertinent past medical history.  There are no problems to display for this patient.   History reviewed. No pertinent surgical history.     No family history on file.  Social History   Tobacco Use  . Smoking status: Never Smoker  . Smokeless tobacco: Never Used  Vaping Use  . Vaping Use: Never used  Substance Use Topics  . Alcohol use: No  . Drug use: No    Home Medications Prior to Admission medications   Medication Sig Start Date End Date Taking? Authorizing Provider  cyclobenzaprine (FLEXERIL) 10 MG tablet Take 1 tablet (10 mg total) by mouth 2 (two) times daily as needed for muscle spasms. 11/01/19   Fayrene Helper, PA-C  ibuprofen (ADVIL) 600 MG tablet Take 1 tablet (600 mg total) by mouth every 6 (six) hours as needed. 11/01/19   Fayrene Helper, PA-C    Allergies    Patient has no known allergies.  Review of Systems   Review of Systems  Respiratory: Negative for shortness of breath.   Cardiovascular: Negative for chest pain.  Gastrointestinal: Negative for abdominal pain.  Neurological: Positive for headaches.  All other systems reviewed and are negative.   Physical  Exam Updated Vital Signs BP 122/74 (BP Location: Right Arm)   Pulse (!) 50   Temp 98.4 F (36.9 C) (Oral)   Resp 14   Ht 6\' 1"  (1.854 m)   Wt 81.6 kg   SpO2 99%   BMI 23.75 kg/m   Physical Exam Vitals and nursing note reviewed.  Constitutional:      Appearance: He is well-developed.  HENT:     Head: Normocephalic and atraumatic.     Mouth/Throat:     Mouth: Mucous membranes are moist.     Pharynx: Oropharynx is clear.  Eyes:     Pupils: Pupils are equal, round, and reactive to light.  Cardiovascular:     Rate and Rhythm: Normal rate.  Pulmonary:     Effort: Pulmonary effort is normal. No respiratory distress.  Abdominal:     General: Abdomen is flat. There is no distension.  Musculoskeletal:        General: Normal range of motion.     Cervical back: Normal range of motion.  Skin:    General: Skin is warm and dry.     Comments: Has abrasions to right forehead, left wrist and right knee.  Neurological:     General: No focal deficit present.     Mental Status: He is alert.     ED Results / Procedures / Treatments   Labs (all labs ordered are listed, but only abnormal results are displayed) Labs Reviewed - No data to display  EKG None  Radiology No results found.  Procedures Procedures   Medications Ordered in ED Medications  neomycin-bacitracin-polymyxin (NEOSPORIN) ointment (has no administration in time range)  acetaminophen (TYLENOL) tablet 1,000 mg (has no administration in time range)  ketorolac (TORADOL) injection 60 mg (has no administration in time range)  Tdap (BOOSTRIX) injection 0.5 mL (has no administration in time range)    ED Course  I have reviewed the triage vital signs and the nursing notes.  Pertinent labs & imaging results that were available during my care of the patient were reviewed by me and considered in my medical decision making (see chart for details).    MDM Rules/Calculators/A&P                          Tdap.  Wound  care.  Pain meds. Likely mild concussion, fall was over 24 hours ago and no s/s for head bleed, no indication for imaging at this time.   Final Clinical Impression(s) / ED Diagnoses Final diagnoses:  Minor head injury, initial encounter    Rx / DC Orders ED Discharge Orders    None       Olie Dibert, Barbara Cower, MD 10/23/20 2323

## 2020-10-23 NOTE — ED Triage Notes (Signed)
C/o fall with abrasion to above right eye brow , left wrist , right knee x 2 days
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
## 2021-09-29 ENCOUNTER — Other Ambulatory Visit: Payer: Self-pay

## 2021-09-29 ENCOUNTER — Encounter (HOSPITAL_BASED_OUTPATIENT_CLINIC_OR_DEPARTMENT_OTHER): Payer: Self-pay | Admitting: Emergency Medicine

## 2021-09-29 ENCOUNTER — Emergency Department (HOSPITAL_BASED_OUTPATIENT_CLINIC_OR_DEPARTMENT_OTHER)
Admission: EM | Admit: 2021-09-29 | Discharge: 2021-09-29 | Disposition: A | Discharge status: Home / Self Care | Payer: Medicare Other | Attending: Emergency Medicine | Admitting: Emergency Medicine

## 2021-09-29 DIAGNOSIS — L03031 Cellulitis of right toe: Secondary | ICD-10-CM | POA: Insufficient documentation

## 2021-09-29 DIAGNOSIS — L03039 Cellulitis of unspecified toe: Secondary | ICD-10-CM

## 2021-09-29 DIAGNOSIS — M79674 Pain in right toe(s): Secondary | ICD-10-CM | POA: Diagnosis present

## 2021-09-29 DIAGNOSIS — L600 Ingrowing nail: Secondary | ICD-10-CM | POA: Diagnosis not present

## 2021-09-29 MED ORDER — IBUPROFEN 800 MG PO TABS: 800.0000 mg | ORAL_TABLET | Freq: Once | ORAL | Status: DC

## 2021-09-29 MED ORDER — BACITRACIN ZINC 500 UNIT/GM EX OINT: 1.0000 "application " | TOPICAL_OINTMENT | Freq: Two times a day (BID) | CUTANEOUS | 0 refills | Status: AC

## 2021-09-29 MED ORDER — LIDOCAINE-EPINEPHRINE-TETRACAINE (LET) TOPICAL GEL
3.0000 mL | Freq: Once | TOPICAL | Status: AC
  Administered 2021-09-29: 3 mL via TOPICAL
  Filled 2021-09-29: qty 3

## 2021-09-29 NOTE — ED Notes (Signed)
Pt soaking his foot and D tray at bedside ?

## 2021-09-29 NOTE — ED Triage Notes (Signed)
Right top foot pain. No known injury X 3 days. ?

## 2021-09-29 NOTE — ED Provider Notes (Signed)
?[facility] EMERGENCY DEPARTMENT ?Provider Note ? ? ?CSN: [id number] ?Arrival date & time: 09/29/21  8563 ? ?  ? ?History ? ?Chief Complaint  ?Patient presents with  ? Foot Pain  ? ? ?Glenn Diaz is a 36 y.o. male. ? ? ?Foot Pain ? ? ?36 year old male with medical history significant for ingrown toenails who presents to the emergency department with a chief complaint of right great toe pain.  He states for the last 3 days he has had pain along the nail along the side/medial aspect of his right great toe.  Pain has increased over the last few days.  He has tried nothing for the pain.  He has not been on any antibiotics. No  known injury to the tone.  He denies any other injuries or complaints.   ? ?Home Medications ?Prior to Admission medications   ?Medication Sig Start Date End Date Taking? Authorizing Provider  ?bacitracin ointment Apply 1 application. topically 2 (two) times daily. 09/29/21  Yes Ernie Avena, MD  ?cyclobenzaprine (FLEXERIL) 10 MG tablet Take 1 tablet (10 mg total) by mouth 2 (two) times daily as needed for muscle spasms. 11/01/19   Fayrene Helper, PA-C  ?ibuprofen (ADVIL) 600 MG tablet Take 1 tablet (600 mg total) by mouth every 6 (six) hours as needed. 11/01/19   Fayrene Helper, PA-C  ?   ? ?Allergies    ?Patient has no known allergies.   ? ?Review of Systems   ?Review of Systems  ?All other systems reviewed and are negative. ? ?Physical Exam ?Updated Vital Signs ?BP 114/70 (BP Location: Right Arm)   Pulse (!) 50   Temp 98.1 ?F (36.7 ?C) (Oral)   Resp 16   Ht 6' 4.5" (1.943 m)   Wt 79.1 kg   SpO2 100%   BMI 20.95 kg/m?  ?Physical Exam ?Vitals and nursing note reviewed.  ?Constitutional:   ?   General: He is not in acute distress. ?HENT:  ?   Head: Normocephalic and atraumatic.  ?Eyes:  ?   Conjunctiva/sclera: Conjunctivae normal.  ?   Pupils: Pupils are equal, round, and reactive to light.  ?Cardiovascular:  ?   Rate and Rhythm: Normal rate and regular rhythm.  ?Pulmonary:  ?   Effort:  Pulmonary effort is normal. No respiratory distress.  ?Abdominal:  ?   General: There is no distension.  ?   Tenderness: There is no guarding.  ?Musculoskeletal:     ?   General: Swelling and tenderness present. No deformity or signs of injury.  ?   Cervical back: Neck supple.  ?   Comments: Right great toe with paronychia present along the medial aspect of the nail.  ?Skin: ?   Findings: No lesion or rash.  ?Neurological:  ?   General: No focal deficit present.  ?   Mental Status: He is alert. Mental status is at baseline.  ? ? ?ED Results / Procedures / Treatments   ?Labs ?(all labs ordered are listed, but only abnormal results are displayed) ?Labs Reviewed - No data to display ? ?EKG ?None ? ?Radiology ?No results found. ? ?Procedures ?Drain paronychia ? ?Date/Time: 09/29/2021 8:34 AM ?Performed by: Ernie Avena, MD ?Authorized by: Ernie Avena, MD  ?Consent: Verbal consent obtained. ?Risks and benefits: risks, benefits and alternatives were discussed ?Consent given by: patient ?Required items: required blood products, implants, devices, and special equipment available ?Patient identity confirmed: verbally with patient ?Local anesthesia used: yes ? ?Anesthesia: ?Local anesthesia used: yes ?Local Anesthetic:  LET (lido, epi, tetracaine) ? ?Sedation: ?Patient sedated: no ? ?Patient tolerance: patient tolerated the procedure well with no immediate complications ? ?  ? ? ?Medications Ordered in ED ?Medications  ?ibuprofen (ADVIL) tablet 800 mg (has no administration in time range)  ?lidocaine-EPINEPHrine-tetracaine (LET) topical gel (3 mLs Topical Given 09/29/21 0806)  ? ? ?ED Course/ Medical Decision Making/ A&P ?  ?                        ?Medical Decision Making ?Risk ?OTC drugs. ?Prescription drug management. ? ? ?36 year old male with medical history significant for ingrown toenails who presents to the emergency department with a chief complaint of right great toe pain.  He states for the last 3 days he has had  pain along the nail along the side/medial aspect of his right great toe.  Pain has increased over the last few days.  He has tried nothing for the pain.  He has not been on any antibiotics. No  known injury to the tone.  He denies any other injuries or complaints.   ? ?On arrival, the patient was vitally stable, physical exam significant for paronychia present along the medial aspect of the right great toe.  No physical exam findings to suggest felon.  We will perform soapy water or warm soak, I&D and reassess. ? ?Minimal scant drainage was noted on I&D. Patient advised warm soapy soaks nightly, daily Bacitracin ointment, and follow-up outpatient with his podiatrist, who he has previously scheduled care with. ? ?Final Clinical Impression(s) / ED Diagnoses ?Final diagnoses:  ?Paronychia of great toe  ?Ingrown nail of great toe  ? ? ?Rx / DC Orders ?ED Discharge Orders   ? ?      Ordered  ?  bacitracin ointment  2 times daily       ? 09/29/21 7989  ? ?  ?  ? ?  ? ? ?  ?Ernie Avena, MD ?09/29/21 [phone] ? ?

## 2021-09-29 NOTE — Discharge Instructions (Addendum)
Recommend continued warm soaks nightly, application of Bacitracin ointment twice daily. Follow-up with podiatry outpatient. ?
